# Patient Record
Sex: Male | Born: 1985 | Race: Black or African American | Hispanic: No | Marital: Single | Smoking: Current every day smoker
Health system: Southern US, Community
[De-identification: ages and names within clinical notes are randomized; demographics above are authoritative.]

## PROBLEM LIST (undated history)

## (undated) DIAGNOSIS — J45909 Unspecified asthma, uncomplicated: Secondary | ICD-10-CM

---

## 2016-03-19 ENCOUNTER — Emergency Department (HOSPITAL_COMMUNITY): Payer: Self-pay

## 2016-03-19 ENCOUNTER — Emergency Department (HOSPITAL_COMMUNITY)
Admission: EM | Admit: 2016-03-19 | Discharge: 2016-03-19 | Disposition: A | Discharge status: Home / Self Care | Payer: Self-pay | Attending: Emergency Medicine | Admitting: Emergency Medicine

## 2016-03-19 DIAGNOSIS — R062 Wheezing: Secondary | ICD-10-CM

## 2016-03-19 DIAGNOSIS — J4521 Mild intermittent asthma with (acute) exacerbation: Secondary | ICD-10-CM | POA: Insufficient documentation

## 2016-03-19 MED ORDER — ALBUTEROL SULFATE HFA 108 (90 BASE) MCG/ACT IN AERS: 1.0000 | INHALATION_SPRAY | Freq: Four times a day (QID) | RESPIRATORY_TRACT | Status: DC | PRN

## 2016-03-19 MED ORDER — ALBUTEROL SULFATE (2.5 MG/3ML) 0.083% IN NEBU
5.0000 mg | INHALATION_SOLUTION | Freq: Once | RESPIRATORY_TRACT | Status: AC
  Administered 2016-03-19: 5 mg via RESPIRATORY_TRACT
  Filled 2016-03-19: qty 6

## 2016-03-19 MED ORDER — ALBUTEROL SULFATE (2.5 MG/3ML) 0.083% IN NEBU: 5.0000 mg | INHALATION_SOLUTION | Freq: Four times a day (QID) | RESPIRATORY_TRACT | Status: AC | PRN

## 2016-03-19 MED ORDER — IPRATROPIUM BROMIDE 0.02 % IN SOLN
0.5000 mg | Freq: Once | RESPIRATORY_TRACT | Status: AC
  Administered 2016-03-19: 0.5 mg via RESPIRATORY_TRACT
  Filled 2016-03-19: qty 2.5

## 2016-03-19 MED ORDER — PREDNISONE 20 MG PO TABS: ORAL_TABLET | ORAL | Status: DC

## 2016-03-19 MED ORDER — PREDNISONE 20 MG PO TABS
60.0000 mg | ORAL_TABLET | Freq: Once | ORAL | Status: AC
  Administered 2016-03-19: 60 mg via ORAL
  Filled 2016-03-19: qty 3

## 2016-03-19 NOTE — ED Notes (Signed)
Pt states he has been coughing and wheezing for the last "few weeks." Denies fever and chills.

## 2016-03-19 NOTE — ED Provider Notes (Signed)
History  By signing my name below, I, Earmon Phoenix, attest that this documentation has been prepared under the direction and in the presence of Sharilyn Sites, PA-C. Electronically Signed: Earmon Phoenix, ED Scribe. 03/19/2016. 7:24 PM.  Chief Complaint  Patient presents with  . Cough  . Wheezing   The history is provided by the patient and medical records. No language interpreter was used.    HPI Comments:  Jason Middleton is a 30 y.o. male with PMHx of asthma who presents to the Emergency Department complaining of difficulty breathing that began about one week ago. He reports associated productive cough and wheezing. He also reports intermittent HA due to coughing. He states the symptoms have been worsening over the past couple days. He has been using his albuterol MDI and home nebulizer with no significant relief of the symptoms. He denies modifying factors. He denies fever, chills, nausea, vomiting, or chest pain. He denies smoking. No cardiac hx.  VSS.  No past medical history on file. No past surgical history on file. No family history on file. Social History  Substance Use Topics  . Smoking status: Not on file  . Smokeless tobacco: Not on file  . Alcohol Use: Not on file    Review of Systems  Respiratory: Positive for cough, shortness of breath and wheezing.   Neurological: Positive for headaches.  All other systems reviewed and are negative.   Allergies  Review of patient's allergies indicates not on file.  Home Medications   Prior to Admission medications   Not on File   Triage Vitals: BP 127/58 mmHg  Pulse 66  Temp(Src) 98.4 F (36.9 C)  Resp 18  SpO2 95% Physical Exam  Constitutional: He is oriented to person, place, and time. He appears well-developed and well-nourished. No distress.  HENT:  Head: Normocephalic and atraumatic.  Mouth/Throat: Oropharynx is clear and moist.  Eyes: Conjunctivae and EOM are normal. Pupils are equal, round, and reactive to  light.  Neck: Normal range of motion. Neck supple.  Cardiovascular: Normal rate, regular rhythm and normal heart sounds.   Pulmonary/Chest: Effort normal. He has wheezes. He has no rales.  Dry cough noted, diffuse expiratory wheezes throughout, no distress, speaking in full sentences without difficulty  Abdominal: Soft. Bowel sounds are normal. There is no tenderness. There is no guarding.  Musculoskeletal: Normal range of motion.  Neurological: He is alert and oriented to person, place, and time.  Skin: Skin is warm and dry. He is not diaphoretic.  Psychiatric: He has a normal mood and affect.  Nursing note and vitals reviewed.   ED Course  Procedures (including critical care time) DIAGNOSTIC STUDIES: Oxygen Saturation is 95% on RA, adequate by my interpretation.   COORDINATION OF CARE: 6:53 PM- Will order CXR and nebulizer treatment. Will prescribe steroids. Pt verbalizes understanding and agrees to plan.  Medications  predniSONE (DELTASONE) tablet 60 mg (60 mg Oral Given 03/19/16 1935)  albuterol (PROVENTIL) (2.5 MG/3ML) 0.083% nebulizer solution 5 mg (5 mg Nebulization Given 03/19/16 1935)  ipratropium (ATROVENT) nebulizer solution 0.5 mg (0.5 mg Nebulization Given 03/19/16 1935)  albuterol (PROVENTIL) (2.5 MG/3ML) 0.083% nebulizer solution 5 mg (5 mg Nebulization Given 03/19/16 2010)    Labs Review Labs Reviewed - No data to display  Imaging Review Dg Chest 2 View  03/19/2016  CLINICAL DATA:  Cough for 1 week. EXAM: CHEST  2 VIEW COMPARISON:  None. FINDINGS: Cardiomediastinal silhouette is normal. Mediastinal contours appear intact. There is no evidence of focal airspace consolidation,  pleural effusion or pneumothorax. Osseous structures are without acute abnormality. Soft tissues are grossly normal. IMPRESSION: No active cardiopulmonary disease. Electronically Signed   By: Ted Mcalpineobrinka  Dimitrova M.D.   On: 03/19/2016 19:21   I have personally reviewed and evaluated these images and lab  results as part of my medical decision-making.   EKG Interpretation None      MDM   Final diagnoses:  Asthma, mild intermittent, with acute exacerbation  Wheezing   30 y.o. M here with wheezing and SOB. Hx of asthma. No improvement with neb at home earlier.  VSS.  Expiratory wheezing noted throughout on exam.  No distress, speaking in full sentences without difficulty.  Will obtain CXR given productive cough.  Dose prednisone, albuterol/atrovent neb given.  8:29 PM CXR clear.  After 2 nebs patient now with minimal wheezing.  Have offered additional treatment, however patient reports he needs to leave to get to work.  Will d/c home with albuterol inhaler, neb solution, and prednisone taper.  Encouraged nebs every 4-6 hours PRN wheezing, inhaler for rescue.  FU with PCP.  Discussed plan with patient, he/she acknowledged understanding and agreed with plan of care.  Return precautions given for new or worsening symptoms.  I personally performed the services described in this documentation, which was scribed in my presence. The recorded information has been reviewed and is accurate.  Garlon HatchetLisa M Taedyn Glasscock, PA-C 03/19/16 2046  Lavera Guiseana Duo Liu, MD 03/21/16 Jacinta [id number]

## 2016-03-19 NOTE — Discharge Instructions (Signed)
Take the prescribed medication as directed.  May use nebulizer treatments very 4-6 hours for wheezing.  Use inhaler for rescue. Follow-up with your primary care doctor. Return to the ED for new or worsening symptoms.

## 2016-04-07 ENCOUNTER — Emergency Department (HOSPITAL_COMMUNITY)
Admission: EM | Admit: 2016-04-07 | Discharge: 2016-04-08 | Disposition: A | Discharge status: Home / Self Care | Payer: Self-pay | Attending: Emergency Medicine | Admitting: Emergency Medicine

## 2016-04-07 ENCOUNTER — Encounter (HOSPITAL_COMMUNITY): Payer: Self-pay | Admitting: Emergency Medicine

## 2016-04-07 DIAGNOSIS — J4521 Mild intermittent asthma with (acute) exacerbation: Secondary | ICD-10-CM | POA: Insufficient documentation

## 2016-04-07 DIAGNOSIS — Z7951 Long term (current) use of inhaled steroids: Secondary | ICD-10-CM | POA: Insufficient documentation

## 2016-04-07 HISTORY — DX: Unspecified asthma, uncomplicated: J45.909

## 2016-04-07 MED ORDER — ALBUTEROL SULFATE (2.5 MG/3ML) 0.083% IN NEBU
5.0000 mg | INHALATION_SOLUTION | Freq: Once | RESPIRATORY_TRACT | Status: AC
  Administered 2016-04-07: 5 mg via RESPIRATORY_TRACT
  Filled 2016-04-07: qty 6

## 2016-04-07 NOTE — ED Notes (Signed)
Pt presents with shortness of breath after waking up this evening around 2130.  Pt states he has asthma and this feels like his normal asthma attacks.  States he used a nebulizer at home but it gave no relief.

## 2016-04-08 MED ORDER — PREDNISONE 20 MG PO TABS
60.0000 mg | ORAL_TABLET | Freq: Once | ORAL | Status: AC
  Administered 2016-04-08: 60 mg via ORAL
  Filled 2016-04-08: qty 3

## 2016-04-08 MED ORDER — PREDNISONE 10 MG PO TABS: 60.0000 mg | ORAL_TABLET | Freq: Every day | ORAL | Status: DC

## 2016-04-08 NOTE — Discharge Instructions (Signed)
We saw you in the ER for your asthma related complains. We gave you some breathing treatments in the ER, and seems like your symptoms have improved. Please take albuterol as needed every 4 hours. Please take the medications prescribed. Please refrain from smoking or smoke exposure. Please see a primary care doctor in 1 week. Return to the ER if your symptoms worsen.   Asthma Attack Prevention While you may not be able to control the fact that you have asthma, you can take actions to prevent asthma attacks. The best way to prevent asthma attacks is to maintain good control of your asthma. You can achieve this by:  Taking your medicines as directed.  Avoiding things that can irritate your airways or make your asthma symptoms worse (asthma triggers).  Keeping track of how well your asthma is controlled and of any changes in your symptoms.  Responding quickly to worsening asthma symptoms (asthma attack).  Seeking emergency care when it is needed. WHAT ARE SOME WAYS TO PREVENT AN ASTHMA ATTACK? Have a Plan Work with your health care provider to create a written plan for managing and treating your asthma attacks (asthma action plan). This plan includes:  A list of your asthma triggers and how you can avoid them.  Information on when medicines should be taken and when their dosages should be changed.  The use of a device that measures how well your lungs are working (peak flow meter). Monitor Your Asthma Use your peak flow meter and record your results in a journal every day. A drop in your peak flow numbers on one or more days may indicate the start of an asthma attack. This can happen even before you start to feel symptoms. You can prevent an asthma attack from getting worse by following the steps in your asthma action plan. Avoid Asthma Triggers Work with your asthma health care provider to find out what your asthma triggers are. This can be done by:  Allergy testing.  Keeping a  journal that notes when asthma attacks occur and the factors that may have contributed to them.  Determining if there are other medical conditions that are making your asthma worse. Once you have determined your asthma triggers, take steps to avoid them. This may include avoiding excessive or prolonged exposure to:  Dust. Have someone dust and vacuum your home for you once or twice a week. Using a high-efficiency particulate arrestance (HEPA) vacuum is best.  Smoke. This includes campfire smoke, forest fire smoke, and secondhand smoke from tobacco products.  Pet dander. Avoid contact with animals that you know you are allergic to.  Allergens from trees, grasses or pollens. Avoid spending a lot of time outdoors when pollen counts are high, and on very windy days.  Very cold, dry, or humid air.  Mold.  Foods that contain high amounts of sulfites.  Strong odors.  Outdoor air pollutants, such as Museum/gallery exhibitions officerengine exhaust.  Indoor air pollutants, such as aerosol sprays and fumes from household cleaners.  Household pests, including dust mites and cockroaches, and pest droppings.  Certain medicines, including NSAIDs. Always talk to your health care provider before stopping or starting any new medicines. Medicines Take over-the-counter and prescription medicines only as told by your health care provider. Many asthma attacks can be prevented by carefully following your medicine schedule. Taking your medicines correctly is especially important when you cannot avoid certain asthma triggers. Act Quickly If an asthma attack does happen, acting quickly can decrease how severe it is and  how long it lasts. Take these steps:   Pay attention to your symptoms. If you are coughing, wheezing, or having difficulty breathing, do not wait to see if your symptoms go away on their own. Follow your asthma action plan.  If you have followed your asthma action plan and your symptoms are not improving, call your health  care provider or seek immediate medical care at the nearest hospital. It is important to note how often you need to use your fast-acting rescue inhaler. If you are using your rescue inhaler more often, it may mean that your asthma is not under control. Adjusting your asthma treatment plan may help you to prevent future asthma attacks and help you to gain better control of your condition. HOW CAN I PREVENT AN ASTHMA ATTACK WHEN I EXERCISE? Follow advice from your health care provider about whether you should use your fast-acting inhaler before exercising. Many people with asthma experience exercise-induced bronchoconstriction (EIB). This condition often worsens during vigorous exercise in cold, humid, or dry environments. Usually, people with EIB can stay very active by pre-treating with a fast-acting inhaler before exercising.   This information is not intended to replace advice given to you by your health care provider. Make sure you discuss any questions you have with your health care provider.   Document Released: 09/23/2009 Document Revised: 06/26/2015 Document Reviewed: 03/07/2015 Elsevier Interactive Patient Education 2016 [employer]  Asthma, Adult Asthma is a recurring condition in which the airways tighten and narrow. Asthma can make it difficult to breathe. It can cause coughing, wheezing, and shortness of breath. Asthma episodes, also called asthma attacks, range from minor to life-threatening. Asthma cannot be cured, but medicines and lifestyle changes can help control it. CAUSES Asthma is believed to be caused by inherited (genetic) and environmental factors, but its exact cause is unknown. Asthma may be triggered by allergens, lung infections, or irritants in the air. Asthma triggers are different for each person. Common triggers include:   Animal dander.  Dust mites.  Cockroaches.  Pollen from trees or grass.  Mold.  Smoke.  Air pollutants such as dust, household  cleaners, hair sprays, aerosol sprays, paint fumes, strong chemicals, or strong odors.  Cold air, weather changes, and winds (which increase molds and pollens in the air).  Strong emotional expressions such as crying or laughing hard.  Stress.  Certain medicines (such as aspirin) or types of drugs (such as beta-blockers).  Sulfites in foods and drinks. Foods and drinks that may contain sulfites include dried fruit, potato chips, and sparkling grape juice.  Infections or inflammatory conditions such as the flu, a cold, or an inflammation of the nasal membranes (rhinitis).  Gastroesophageal reflux disease (GERD).  Exercise or strenuous activity. SYMPTOMS Symptoms may occur immediately after asthma is triggered or many hours later. Symptoms include:  Wheezing.  Excessive nighttime or early morning coughing.  Frequent or severe coughing with a common cold.  Chest tightness.  Shortness of breath. DIAGNOSIS  The diagnosis of asthma is made by a review of your medical history and a physical exam. Tests may also be performed. These may include:  Lung function studies. These tests show how much air you breathe in and out.  Allergy tests.  Imaging tests such as X-rays. TREATMENT  Asthma cannot be cured, but it can usually be controlled. Treatment involves identifying and avoiding your asthma triggers. It also involves medicines. There are 2 classes of medicine used for asthma treatment:   Controller medicines. These prevent asthma  symptoms from occurring. They are usually taken every day.  Reliever or rescue medicines. These quickly relieve asthma symptoms. They are used as needed and provide short-term relief. Your health care provider will help you create an asthma action plan. An asthma action plan is a written plan for managing and treating your asthma attacks. It includes a list of your asthma triggers and how they may be avoided. It also includes information on when medicines  should be taken and when their dosage should be changed. An action plan may also involve the use of a device called a peak flow meter. A peak flow meter measures how well the lungs are working. It helps you monitor your condition. HOME CARE INSTRUCTIONS   Take medicines only as directed by your health care provider. Speak with your health care provider if you have questions about how or when to take the medicines.  Use a peak flow meter as directed by your health care provider. Record and keep track of readings.  Understand and use the action plan to help minimize or stop an asthma attack without needing to seek medical care.  Control your home environment in the following ways to help prevent asthma attacks:  Do not smoke. Avoid being exposed to secondhand smoke.  Change your heating and air conditioning filter regularly.  Limit your use of fireplaces and wood stoves.  Get rid of pests (such as roaches and mice) and their droppings.  Throw away plants if you see mold on them.  Clean your floors and dust regularly. Use unscented cleaning products.  Try to have someone else vacuum for you regularly. Stay out of rooms while they are being vacuumed and for a short while afterward. If you vacuum, use a dust mask from a hardware store, a double-layered or microfilter vacuum cleaner bag, or a vacuum cleaner with a HEPA filter.  Replace carpet with wood, tile, or vinyl flooring. Carpet can trap dander and dust.  Use allergy-proof pillows, mattress covers, and box spring covers.  Wash bed sheets and blankets every week in hot water and dry them in a dryer.  Use blankets that are made of polyester or cotton.  Clean bathrooms and kitchens with bleach. If possible, have someone repaint the walls in these rooms with mold-resistant paint. Keep out of the rooms that are being cleaned and painted.  Wash hands frequently. SEEK MEDICAL CARE IF:   You have wheezing, shortness of breath, or a  cough even if taking medicine to prevent attacks.  The colored mucus you cough up (sputum) is thicker than usual.  Your sputum changes from clear or white to yellow, green, gray, or bloody.  You have any problems that may be related to the medicines you are taking (such as a rash, itching, swelling, or trouble breathing).  You are using a reliever medicine more than 2-3 times per week.  Your peak flow is still at 50-79% of your personal best after following your action plan for 1 hour.  You have a fever. SEEK IMMEDIATE MEDICAL CARE IF:   You seem to be getting worse and are unresponsive to treatment during an asthma attack.  You are short of breath even at rest.  You get short of breath when doing very little physical activity.  You have difficulty eating, drinking, or talking due to asthma symptoms.  You develop chest pain.  You develop a fast heartbeat.  You have a bluish color to your lips or fingernails.  You are light-headed, dizzy,  or faint.  Your peak flow is less than 50% of your personal best.   This information is not intended to replace advice given to you by your health care provider. Make sure you discuss any questions you have with your health care provider.   Document Released: 10/05/2005 Document Revised: 06/26/2015 Document Reviewed: 05/04/2013 Elsevier Interactive Patient Education [employer]

## 2016-04-08 NOTE — ED Provider Notes (Signed)
CSN: [id number]     Arrival date & time 04/07/16  2258 History  By signing my name below, I, Emmanuella Mensah, attest that this documentation has been prepared under the direction and in the presence of Derwood Kaplan, MD. Electronically Signed: Angelene Giovanni, ED Scribe. 04/08/2016. 1:46 AM.    Chief Complaint  Patient presents with  . Asthma   Patient is a 30 y.o. male presenting with asthma. The history is provided by the patient. No language interpreter was used.  Asthma This is a chronic problem. The current episode started 1 to 2 hours ago. The problem has been gradually improving. Associated symptoms include shortness of breath. Pertinent negatives include no chest pain. Nothing aggravates the symptoms. Nothing relieves the symptoms. Treatments tried: Home nebulizer. The treatment provided no relief.   HPI Comments: Geramy Lamorte is a 30 y.o. male with a hx of asthma who presents to the Emergency Department complaining of ongoing gradually improving wheezing consistent with his asthma flare ups onset 11 pm yesterday. He reports associated shortness of breath and non-productive cough. He denies any specific triggers for the acute episode PTA. He adds that he normally has an asthma flare up when he is sick. No alleviating factors noted. Pt states that he used a home nebulizer PTA with no relief. He had a nebulizer treatment here in the ED and reports mild relief from it. No hx of smoking. He denies any URI symptoms, chest pain, or n/v.   Past Medical History  Diagnosis Date  . Asthma    History reviewed. No pertinent past surgical history. No family history on file. Social History  Substance Use Topics  . Smoking status: Never Smoker   . Smokeless tobacco: Never Used  . Alcohol Use: No    Review of Systems  Respiratory: Positive for shortness of breath.   Cardiovascular: Negative for chest pain.    A complete 10 system review of systems was obtained and all systems are  negative except as noted in the HPI and PMH.    Allergies  Review of patient's allergies indicates no known allergies.  Home Medications   Prior to Admission medications   Medication Sig Start Date End Date Taking? Authorizing Provider  albuterol (PROVENTIL HFA;VENTOLIN HFA) 108 (90 Base) MCG/ACT inhaler Inhale 1-2 puffs into the lungs every 6 (six) hours as needed for wheezing. 03/19/16  Yes Garlon Hatchet, PA-C  albuterol (PROVENTIL) (2.5 MG/3ML) 0.083% nebulizer solution Take 6 mLs (5 mg total) by nebulization every 6 (six) hours as needed for wheezing or shortness of breath. 03/19/16  Yes Garlon Hatchet, PA-C  predniSONE (DELTASONE) 10 MG tablet Take 6 tablets (60 mg total) by mouth daily. 04/08/16   Feras Gardella, MD   BP 139/82 mmHg  Pulse 54  Temp(Src) 98.1 F (36.7 C) (Oral)  Resp 18  Ht  (1.778 m)  Wt 200 lb (90.719 kg)  BMI 28.70 kg/m2  SpO2 96% Physical Exam  Constitutional: He is oriented to person, place, and time. He appears well-developed and well-nourished.  HENT:  Head: Normocephalic and atraumatic.  Cardiovascular: Normal rate.   Pulmonary/Chest: Effort normal. He has wheezes.  Fine end expiratory wheezing  Neurological: He is alert and oriented to person, place, and time.  Skin: Skin is warm and dry.  Psychiatric: He has a normal mood and affect.  Nursing note and vitals reviewed.   ED Course  Procedures (including critical care time) DIAGNOSTIC STUDIES: Oxygen Saturation is 99% on RA, normal by my  interpretation.    COORDINATION OF CARE: 1:43 AM- Pt advised of plan for treatment and pt agrees. Pt will receive short course of prednisone treatment. He declined second albuterol treatment.    Labs Review Labs Reviewed - No data to display  Imaging Review No results found.   Derwood KaplanAnkit Jackilyn Umphlett, MD has personally reviewed and evaluated these images and lab results as part of his medical decision-making.   EKG Interpretation None      MDM   Final  diagnoses:  Acute asthma exacerbation, mild intermittent    I personally performed the services described in this documentation, which was scribed in my presence. The recorded information has been reviewed and is accurate.   Asthma attack that is responding to meds. Stable for d/c.   Derwood KaplanAnkit Manasi Dishon, MD 04/13/16 1256

## 2016-05-14 ENCOUNTER — Encounter (HOSPITAL_COMMUNITY): Payer: Self-pay | Admitting: Emergency Medicine

## 2016-05-14 ENCOUNTER — Emergency Department (HOSPITAL_COMMUNITY)
Admission: EM | Admit: 2016-05-14 | Discharge: 2016-05-14 | Disposition: A | Discharge status: Home / Self Care | Payer: Self-pay | Attending: Emergency Medicine | Admitting: Emergency Medicine

## 2016-05-14 DIAGNOSIS — Z79891 Long term (current) use of opiate analgesic: Secondary | ICD-10-CM | POA: Insufficient documentation

## 2016-05-14 DIAGNOSIS — J45901 Unspecified asthma with (acute) exacerbation: Secondary | ICD-10-CM | POA: Insufficient documentation

## 2016-05-14 DIAGNOSIS — Z7951 Long term (current) use of inhaled steroids: Secondary | ICD-10-CM | POA: Insufficient documentation

## 2016-05-14 MED ORDER — LORATADINE 10 MG PO TABS
10.0000 mg | ORAL_TABLET | Freq: Once | ORAL | Status: AC
  Administered 2016-05-14: 10 mg via ORAL
  Filled 2016-05-14: qty 1

## 2016-05-14 MED ORDER — ALBUTEROL SULFATE HFA 108 (90 BASE) MCG/ACT IN AERS: 2.0000 | INHALATION_SPRAY | RESPIRATORY_TRACT | 3 refills | Status: DC | PRN

## 2016-05-14 MED ORDER — IPRATROPIUM BROMIDE 0.02 % IN SOLN
0.5000 mg | Freq: Once | RESPIRATORY_TRACT | Status: AC
  Administered 2016-05-14: 0.5 mg via RESPIRATORY_TRACT
  Filled 2016-05-14: qty 2.5

## 2016-05-14 MED ORDER — PREDNISONE 20 MG PO TABS: 40.0000 mg | ORAL_TABLET | Freq: Every day | ORAL | 0 refills | Status: AC

## 2016-05-14 MED ORDER — PREDNISONE 20 MG PO TABS
60.0000 mg | ORAL_TABLET | Freq: Once | ORAL | Status: AC
  Administered 2016-05-14: 60 mg via ORAL
  Filled 2016-05-14: qty 3

## 2016-05-14 MED ORDER — IPRATROPIUM-ALBUTEROL 0.5-2.5 (3) MG/3ML IN SOLN
3.0000 mL | Freq: Once | RESPIRATORY_TRACT | Status: AC
  Administered 2016-05-14: 3 mL via RESPIRATORY_TRACT
  Filled 2016-05-14: qty 3

## 2016-05-14 MED ORDER — ALBUTEROL SULFATE (2.5 MG/3ML) 0.083% IN NEBU
2.5000 mg | INHALATION_SOLUTION | Freq: Once | RESPIRATORY_TRACT | Status: AC
  Administered 2016-05-14: 2.5 mg via RESPIRATORY_TRACT
  Filled 2016-05-14: qty 3

## 2016-05-14 NOTE — ED Notes (Signed)
Pt declined pulse ox while ambulating, stating that " He walked here" so he doesn't need to do it.

## 2016-05-14 NOTE — ED Triage Notes (Signed)
Pt states that he has been having trouble with his asthma today. Has run out of his inhaler and feels like he can't catch his breath.  No audible wheezing.  NAD.  Pt able to complete sentences. Ambulatory.

## 2016-05-14 NOTE — ED Provider Notes (Signed)
[facility]-EMERGENCY DEPT Provider Note   CSN: [id number] Arrival date & time: 05/14/16  4098  First Provider Contact:  First MD Initiated Contact with Patient 05/14/16 0243        History   Chief Complaint Chief Complaint  Patient presents with  . Asthma    HPI Jason Middleton is a 30 y.o. male.  30 year old male with a history of asthma presents to the emergency department for evaluation of worsening shortness of breath. He states that he has been experiencing shortness of breath over the last day. He reports recently running out of his inhaler. He states that he feels as though he "can't catch his breath". Patient reports a cough productive of clear mucus as well as some nasal congestion. He denies known seasonal allergies. He has not had any recent fevers. No hemoptysis. He cannot recall any sick contacts. He states that his symptoms feel similar to past asthma exacerbations.      Past Medical History:  Diagnosis Date  . Asthma     There are no active problems to display for this patient.   No past surgical history on file.     Home Medications    Prior to Admission medications   Medication Sig Start Date End Date Taking? Authorizing Provider  acetaminophen (TYLENOL) 500 MG tablet Take 500 mg by mouth every 6 (six) hours as needed for mild pain.   Yes Historical Provider, MD  albuterol (PROVENTIL) (2.5 MG/3ML) 0.083% nebulizer solution Take 6 mLs (5 mg total) by nebulization every 6 (six) hours as needed for wheezing or shortness of breath. 03/19/16  Yes Garlon Hatchet, PA-C  albuterol (PROVENTIL HFA;VENTOLIN HFA) 108 (90 Base) MCG/ACT inhaler Inhale 2 puffs into the lungs every 4 (four) hours as needed for wheezing or shortness of breath. 05/14/16   Antony Madura, PA-C  predniSONE (DELTASONE) 20 MG tablet Take 2 tablets (40 mg total) by mouth daily. 05/14/16   Antony Madura, PA-C    Family History No family history on file.  Social History Social History  Substance Use  Topics  . Smoking status: Never Smoker  . Smokeless tobacco: Never Used  . Alcohol use No     Allergies   Review of patient's allergies indicates no known allergies.   Review of Systems Review of Systems  Constitutional: Negative for fever.  HENT: Positive for congestion.   Respiratory: Positive for cough, chest tightness and shortness of breath.   Ten systems reviewed and are negative for acute change, except as noted in the HPI.    Physical Exam Updated Vital Signs BP 149/98 (BP Location: Left Arm)   Pulse (!) 52   Temp 97.4 F (36.3 C) (Oral)   Resp 18   SpO2 97%   Physical Exam  Constitutional: He is oriented to person, place, and time. He appears well-developed and well-nourished. No distress.  Nontoxic appearing, in no distress  HENT:  Head: Normocephalic and atraumatic.  Eyes: Conjunctivae and EOM are normal. No scleral icterus.  Neck: Normal range of motion.  Pulmonary/Chest: Effort normal. No respiratory distress. He has wheezes.  Diffuse expiratory wheeze. No tachypnea, dyspnea, or accessory muscle use. Chest expansion symmetric.  Musculoskeletal: Normal range of motion.  Neurological: He is alert and oriented to person, place, and time.  Skin: Skin is warm and dry. No rash noted. He is not diaphoretic. No erythema. No pallor.  Psychiatric: He has a normal mood and affect. His behavior is normal.  Nursing note and vitals reviewed.  ED Treatments / Results  Labs (all labs ordered are listed, but only abnormal results are displayed) Labs Reviewed - No data to display  EKG  EKG Interpretation None       Radiology No results found.  Procedures Procedures (including critical care time)  Medications Ordered in ED Medications  ipratropium-albuterol (DUONEB) 0.5-2.5 (3) MG/3ML nebulizer solution 3 mL (3 mLs Nebulization Given 05/14/16 0208)  albuterol (PROVENTIL) (2.5 MG/3ML) 0.083% nebulizer solution 2.5 mg (2.5 mg Nebulization Given 05/14/16 0252)   ipratropium (ATROVENT) nebulizer solution 0.5 mg (0.5 mg Nebulization Given 05/14/16 0252)  predniSONE (DELTASONE) tablet 60 mg (60 mg Oral Given 05/14/16 0251)  loratadine (CLARITIN) tablet 10 mg (10 mg Oral Given 05/14/16 0252)     Initial Impression / Assessment and Plan / ED Course  I have reviewed the triage vital signs and the nursing notes.  Pertinent labs & imaging results that were available during my care of the patient were reviewed by me and considered in my medical decision making (see chart for details).  Clinical Course    Patient presenting for asthma exacerbation. He reports hx of similar symptoms; he is out of his albuterol inhaler. DuoNeb given x 2. Patient states that he is feeling better and he is requesting discharge. He declines pulse oximetry with ambulation. Vitals noted to be stable; no hypoxia. Prednisone given in the ED and patient will be discharged with 5 day burst. Patient has been instructed to continue using prescribed medications and to speak with a PCP about today's exacerbation. Return precautions given at discharge. Patient discharged from the department in satisfactory condition.   Final Clinical Impressions(s) / ED Diagnoses   Final diagnoses:  Asthma exacerbation    New Prescriptions Discharge Medication List as of 05/14/2016  4:22 AM       Antony Madura, PA-C 05/14/16 4540    Dione Booze, MD 05/14/16 [phone]

## 2016-07-19 ENCOUNTER — Emergency Department (HOSPITAL_COMMUNITY)
Admission: EM | Admit: 2016-07-19 | Discharge: 2016-07-19 | Disposition: A | Discharge status: Home / Self Care | Payer: Self-pay | Attending: Emergency Medicine | Admitting: Emergency Medicine

## 2016-07-19 ENCOUNTER — Emergency Department (HOSPITAL_COMMUNITY): Payer: Self-pay

## 2016-07-19 ENCOUNTER — Encounter (HOSPITAL_COMMUNITY): Payer: Self-pay

## 2016-07-19 DIAGNOSIS — S5002XA Contusion of left elbow, initial encounter: Secondary | ICD-10-CM | POA: Insufficient documentation

## 2016-07-19 DIAGNOSIS — J45909 Unspecified asthma, uncomplicated: Secondary | ICD-10-CM | POA: Insufficient documentation

## 2016-07-19 DIAGNOSIS — Y929 Unspecified place or not applicable: Secondary | ICD-10-CM | POA: Insufficient documentation

## 2016-07-19 DIAGNOSIS — Y999 Unspecified external cause status: Secondary | ICD-10-CM | POA: Insufficient documentation

## 2016-07-19 DIAGNOSIS — F17210 Nicotine dependence, cigarettes, uncomplicated: Secondary | ICD-10-CM | POA: Insufficient documentation

## 2016-07-19 DIAGNOSIS — S0101XA Laceration without foreign body of scalp, initial encounter: Secondary | ICD-10-CM | POA: Insufficient documentation

## 2016-07-19 DIAGNOSIS — Y939 Activity, unspecified: Secondary | ICD-10-CM | POA: Insufficient documentation

## 2016-07-19 DIAGNOSIS — S0990XA Unspecified injury of head, initial encounter: Secondary | ICD-10-CM

## 2016-07-19 MED ORDER — IBUPROFEN 200 MG PO TABS
600.0000 mg | ORAL_TABLET | Freq: Once | ORAL | Status: AC
  Administered 2016-07-19: 600 mg via ORAL
  Filled 2016-07-19: qty 3

## 2016-07-19 MED ORDER — ACETAMINOPHEN 325 MG PO TABS
650.0000 mg | ORAL_TABLET | Freq: Once | ORAL | Status: AC
  Administered 2016-07-19: 650 mg via ORAL
  Filled 2016-07-19: qty 2
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
## 2016-07-20 ENCOUNTER — Emergency Department (HOSPITAL_COMMUNITY)
Admission: EM | Admit: 2016-07-20 | Discharge: 2016-07-20 | Disposition: A | Discharge status: Home / Self Care | Payer: Self-pay | Attending: Emergency Medicine | Admitting: Emergency Medicine

## 2016-07-20 ENCOUNTER — Emergency Department (HOSPITAL_COMMUNITY): Payer: Self-pay

## 2016-07-20 ENCOUNTER — Encounter (HOSPITAL_COMMUNITY): Payer: Self-pay | Admitting: Emergency Medicine

## 2016-07-20 DIAGNOSIS — Y929 Unspecified place or not applicable: Secondary | ICD-10-CM | POA: Insufficient documentation

## 2016-07-20 DIAGNOSIS — J45909 Unspecified asthma, uncomplicated: Secondary | ICD-10-CM | POA: Insufficient documentation

## 2016-07-20 DIAGNOSIS — G44309 Post-traumatic headache, unspecified, not intractable: Secondary | ICD-10-CM

## 2016-07-20 DIAGNOSIS — R55 Syncope and collapse: Secondary | ICD-10-CM | POA: Insufficient documentation

## 2016-07-20 DIAGNOSIS — F17210 Nicotine dependence, cigarettes, uncomplicated: Secondary | ICD-10-CM | POA: Insufficient documentation

## 2016-07-20 DIAGNOSIS — Y999 Unspecified external cause status: Secondary | ICD-10-CM | POA: Insufficient documentation

## 2016-07-20 DIAGNOSIS — Y939 Activity, unspecified: Secondary | ICD-10-CM | POA: Insufficient documentation

## 2016-07-20 DIAGNOSIS — F0781 Postconcussional syndrome: Secondary | ICD-10-CM | POA: Insufficient documentation

## 2016-07-20 LAB — BASIC METABOLIC PANEL
ANION GAP: 8 (ref 5–15)
BUN: 11 mg/dL (ref 6–20)
CO2: 22 mmol/L (ref 22–32)
CREATININE: 1.14 mg/dL (ref 0.61–1.24)
Calcium: 8.7 mg/dL — ABNORMAL LOW (ref 8.9–10.3)
Chloride: 110 mmol/L (ref 101–111)
GFR calc non Af Amer: >60 mL/min (ref >60)
GLUCOSE: 107 mg/dL — AB (ref 65–99)
Potassium: 3.7 mmol/L (ref 3.5–5.1)
Sodium: 140 mmol/L (ref 135–145)

## 2016-07-20 LAB — CBC
HEMATOCRIT: 39.9 % (ref 39.0–52.0)
Hemoglobin: 13.3 g/dL (ref 13.0–17.0)
MCH: 29 pg (ref 26.0–34.0)
MCHC: 33.3 g/dL (ref 30.0–36.0)
MCV: 86.9 fL (ref 78.0–100.0)
Platelets: 183 10*3/uL (ref 150–400)
RBC: 4.59 MIL/uL (ref 4.22–5.81)
RDW: 12.9 % (ref 11.5–15.5)
WBC: 8.6 10*3/uL (ref 4.0–10.5)

## 2016-07-20 LAB — CBG MONITORING, ED: Glucose-Capillary: 103 mg/dL — ABNORMAL HIGH (ref 65–99)

## 2016-07-20 MED ORDER — ACETAMINOPHEN 500 MG PO TABS
1000.0000 mg | ORAL_TABLET | Freq: Once | ORAL | Status: AC
  Administered 2016-07-20: 1000 mg via ORAL
  Filled 2016-07-20: qty 2

## 2016-07-20 MED ORDER — SODIUM CHLORIDE 0.9 % IV BOLUS (SEPSIS)
1000.0000 mL | Freq: Once | INTRAVENOUS | Status: AC
  Administered 2016-07-20: 1000 mL via INTRAVENOUS
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
